# Patient Record
Sex: Male | Born: 1975 | Race: White | Hispanic: No | Marital: Single | Smoking: Never smoker
Health system: Southern US, Community
[De-identification: ages and names within clinical notes are randomized; demographics above are authoritative.]

## PROBLEM LIST (undated history)

## (undated) DIAGNOSIS — Z9289 Personal history of other medical treatment: Secondary | ICD-10-CM

## (undated) DIAGNOSIS — I499 Cardiac arrhythmia, unspecified: Secondary | ICD-10-CM

## (undated) DIAGNOSIS — K219 Gastro-esophageal reflux disease without esophagitis: Secondary | ICD-10-CM

## (undated) DIAGNOSIS — K760 Fatty (change of) liver, not elsewhere classified: Secondary | ICD-10-CM

## (undated) DIAGNOSIS — K420 Umbilical hernia with obstruction, without gangrene: Secondary | ICD-10-CM

## (undated) DIAGNOSIS — F419 Anxiety disorder, unspecified: Secondary | ICD-10-CM

## (undated) DIAGNOSIS — M25519 Pain in unspecified shoulder: Secondary | ICD-10-CM

## (undated) DIAGNOSIS — K8010 Calculus of gallbladder with chronic cholecystitis without obstruction: Secondary | ICD-10-CM

## (undated) DIAGNOSIS — E0590 Thyrotoxicosis, unspecified without thyrotoxic crisis or storm: Secondary | ICD-10-CM

## (undated) HISTORY — PX: WISDOM TOOTH EXTRACTION: SHX21

---
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
## 2015-11-18 ENCOUNTER — Encounter (HOSPITAL_COMMUNITY): Payer: Self-pay | Admitting: Emergency Medicine

## 2015-11-18 ENCOUNTER — Emergency Department (HOSPITAL_COMMUNITY)
Admission: EM | Admit: 2015-11-18 | Discharge: 2015-11-19 | Disposition: A | Discharge status: Home / Self Care | Payer: BLUE CROSS/BLUE SHIELD | Attending: Emergency Medicine | Admitting: Emergency Medicine

## 2015-11-18 DIAGNOSIS — K8050 Calculus of bile duct without cholangitis or cholecystitis without obstruction: Secondary | ICD-10-CM | POA: Insufficient documentation

## 2015-11-18 DIAGNOSIS — R1011 Right upper quadrant pain: Secondary | ICD-10-CM | POA: Diagnosis present

## 2015-11-18 DIAGNOSIS — Z79899 Other long term (current) drug therapy: Secondary | ICD-10-CM | POA: Diagnosis not present

## 2015-11-18 DIAGNOSIS — K219 Gastro-esophageal reflux disease without esophagitis: Secondary | ICD-10-CM | POA: Insufficient documentation

## 2015-11-18 HISTORY — DX: Gastro-esophageal reflux disease without esophagitis: K21.9

## 2015-11-18 NOTE — ED Notes (Signed)
Pt states that on Sunday he drank some liquor and experienced multiple episodes of emesis. Ever since then, pt has had right sided abdominal pain that occasionally goes to his flank. Pt reports it is a burning muscular feeling. Pt states this happened months ago when he drank a liquor drink. Pt states his pain is worse when he takes a deep breath. Pt denies n,v, d. Pt denies urinary symptoms.

## 2015-11-19 ENCOUNTER — Emergency Department (HOSPITAL_COMMUNITY): Payer: BLUE CROSS/BLUE SHIELD

## 2015-11-19 ENCOUNTER — Encounter (HOSPITAL_COMMUNITY): Payer: Self-pay | Admitting: Emergency Medicine

## 2015-11-19 LAB — CBC
HEMATOCRIT: 43.1 % (ref 39.0–52.0)
Hemoglobin: 15.2 g/dL (ref 13.0–17.0)
MCH: 28 pg (ref 26.0–34.0)
MCHC: 35.3 g/dL (ref 30.0–36.0)
MCV: 79.5 fL (ref 78.0–100.0)
Platelets: 251 10*3/uL (ref 150–400)
RBC: 5.42 MIL/uL (ref 4.22–5.81)
RDW: 13.2 % (ref 11.5–15.5)
WBC: 10.8 10*3/uL — ABNORMAL HIGH (ref 4.0–10.5)

## 2015-11-19 LAB — COMPREHENSIVE METABOLIC PANEL
ALBUMIN: 4.6 g/dL (ref 3.5–5.0)
ALK PHOS: 92 U/L (ref 38–126)
ALT: 250 U/L — ABNORMAL HIGH (ref 17–63)
AST: 256 U/L — AB (ref 15–41)
Anion gap: 11 (ref 5–15)
BILIRUBIN TOTAL: 1.6 mg/dL — AB (ref 0.3–1.2)
BUN: 15 mg/dL (ref 6–20)
CALCIUM: 9.3 mg/dL (ref 8.9–10.3)
CO2: 23 mmol/L (ref 22–32)
Chloride: 103 mmol/L (ref 101–111)
Creatinine, Ser: 1.03 mg/dL (ref 0.61–1.24)
GFR calc Af Amer: >60 mL/min (ref >60)
GFR calc non Af Amer: >60 mL/min (ref >60)
GLUCOSE: 129 mg/dL — AB (ref 65–99)
POTASSIUM: 3.6 mmol/L (ref 3.5–5.1)
SODIUM: 137 mmol/L (ref 135–145)
TOTAL PROTEIN: 7.7 g/dL (ref 6.5–8.1)

## 2015-11-19 LAB — URINALYSIS, ROUTINE W REFLEX MICROSCOPIC
BILIRUBIN URINE: NEGATIVE
Glucose, UA: NEGATIVE mg/dL
HGB URINE DIPSTICK: NEGATIVE
KETONES UR: NEGATIVE mg/dL
Leukocytes, UA: NEGATIVE
NITRITE: NEGATIVE
PH: 6.5 (ref 5.0–8.0)
Protein, ur: NEGATIVE mg/dL
SPECIFIC GRAVITY, URINE: 1.013 (ref 1.005–1.030)

## 2015-11-19 LAB — LIPASE, BLOOD: Lipase: 45 U/L (ref 11–51)

## 2015-11-19 MED ORDER — GI COCKTAIL ~~LOC~~
30.0000 mL | Freq: Once | ORAL | Status: AC
Start: 2015-11-19 — End: 2015-11-19
  Administered 2015-11-19: 30 mL via ORAL
  Filled 2015-11-19: qty 30

## 2015-11-19 MED ORDER — PANTOPRAZOLE SODIUM 40 MG PO TBEC: 40.0000 mg | DELAYED_RELEASE_TABLET | Freq: Every day | ORAL | Status: AC

## 2015-11-19 MED ORDER — SUCRALFATE 1 GM/10ML PO SUSP: 1.0000 g | Freq: Three times a day (TID) | ORAL | Status: AC

## 2015-11-19 MED ORDER — DICLOFENAC SODIUM ER 100 MG PO TB24: 100.0000 mg | ORAL_TABLET | Freq: Every day | ORAL | Status: AC

## 2015-11-19 NOTE — Discharge Instructions (Signed)
Cholelithiasis °Cholelithiasis (also called gallstones) is a form of gallbladder disease. The gallbladder is a small organ that helps you digest fats. Symptoms of gallstones are: °· Feeling sick to your stomach (nausea). °· Throwing up (vomiting). °· Belly pain. °· Yellowing of the skin (jaundice). °· Sudden pain. You may feel the pain for minutes to hours. °· Fever. °· Pain to the touch. °HOME CARE °· Only take medicines as told by your doctor. °· Eat a low-fat diet until you see your doctor again. Eating fat can result in pain. °· Follow up with your doctor as told. Attacks usually happen time after time. Surgery is usually needed for permanent treatment. °GET HELP RIGHT AWAY IF:  °· Your pain gets worse. °· Your pain is not helped by medicines. °· You have a fever and lasting symptoms for more than 2-3 days. °· You have a fever and your symptoms suddenly get worse. °· You keep feeling sick to your stomach and throwing up. °MAKE SURE YOU:  °· Understand these instructions. °· Will watch your condition. °· Will get help right away if you are not doing well or get worse. °  °This information is not intended to replace advice given to you by your health care provider. Make sure you discuss any questions you have with your health care provider. °  °Document Released: 01/31/2008 Document Revised: 04/16/2013 Document Reviewed: 02/05/2013 °Elsevier Interactive Patient Education ©2016 [employer] Inc. ° °

## 2015-11-19 NOTE — ED Notes (Signed)
Pt reports previous RUQ pain radiating to back rated 9/10, currently resolved to 2/10. He admits to heavy alcohol intake up until Feb 2017 when he made "massive changes" to his diet and lifestyle, eliminating most alcohol intake from his diet.  No prior dx of pancreatitis per pt report. He states that he had some drinks on Sunday and later that evening began feeling the abdominal pain and bloating as well as nausea and vomiting.  The pain and nausea continued for several days but now after arrival to the ED have mostly dissipated.  This RN educated the patient about the signs and symptoms of hepatic and pancreatic diseases and the likelihood of developing similar conditions if he does not stop drinking alcohol.  He verbalized understanding and stated that he is ready to quit drinking altogether.

## 2015-11-19 NOTE — ED Provider Notes (Signed)
CSN: [id number]     Arrival date & time 11/18/15  2336 History   By signing my name below, I, Arlan Organ, attest that this documentation has been prepared under the direction and in the presence of Shronda Boeh, MD.  Electronically Signed: Arlan Organ, ED Scribe. 11/19/2015. 2:06 AM.   Chief Complaint  Patient presents with  . Abdominal Pain   Patient is a 41 y.o. male presenting with abdominal pain. The history is provided by the patient. No language interpreter was used.  Abdominal Pain Pain location:  RUQ Pain quality: sharp   Pain radiates to:  Back Pain severity:  Moderate Duration:  5 days Timing:  Intermittent Progression:  Improving Chronicity:  New Context: alcohol use and diet changes   Relieved by:  None tried Worsened by:  Nothing tried Ineffective treatments:  None tried Associated symptoms: nausea   Associated symptoms: no chest pain, no chills, no cough, no fever, no shortness of breath and no vomiting   Risk factors: has not had multiple surgeries     HPI Comments: Brad Crosby is a 40 y.o. male with a PMHx of GERD who presents to the Emergency Department complaining of intermittent, ongoing RUQ abdominal pain that radiates to the back x 4-5 fays. Currently he rates pain 2/10. Pt admits to heavy alcohol intake 6 months ago but has cut back in the last several months. However, pt admits to having a few margaritas along with [place] food over the weekend and in the last several days. Pt also reports intermittent nausea. No aggravating or alleviating factors at this time. No OTC medications or home remedies attempted prior to arrival. No recent fever, chills, vomiting, diarrhea, chest pain, or shortness of breath. No urinary symptoms reported.  PCP: No primary care provider on file.    Past Medical History  Diagnosis Date  . GERD (gastroesophageal reflux disease)    History reviewed. No pertinent past surgical history. History reviewed. No pertinent family  history. Social History  Substance Use Topics  . Smoking status: Never Smoker   . Smokeless tobacco: None  . Alcohol Use: Yes     Comment: socially     Review of Systems  Constitutional: Negative for fever and chills.  Respiratory: Negative for cough and shortness of breath.   Cardiovascular: Negative for chest pain.  Gastrointestinal: Positive for nausea and abdominal pain. Negative for vomiting.  Neurological: Negative for headaches.  Psychiatric/Behavioral: Negative for confusion.  All other systems reviewed and are negative.     Allergies  Codeine  Home Medications   Prior to Admission medications   Medication Sig Start Date End Date Taking? Authorizing Provider  Multiple Vitamin (MULTIVITAMIN WITH MINERALS) TABS tablet Take 1 tablet by mouth daily.   Yes Historical Provider, MD  omeprazole (PRILOSEC OTC) 20 MG tablet Take 20 mg by mouth daily.   Yes Historical Provider, MD   Triage Vitals: BP 139/95 mmHg  Pulse 91  Temp(Src) 97.8 F (36.6 C) (Oral)  Resp 16  Ht  (1.803 m)  Wt 245 lb (111.131 kg)  BMI 34.19 kg/m2  SpO2 99%   Physical Exam  Constitutional: He is oriented to person, place, and time. He appears well-developed and well-nourished.  HENT:  Head: Normocephalic and atraumatic.  Mouth/Throat: Oropharynx is clear and moist.  Eyes: EOM are normal. Pupils are equal, round, and reactive to light.  Neck: Normal range of motion.  Cardiovascular: Normal rate, regular rhythm, normal heart sounds and intact distal pulses.   Pulmonary/Chest:  Effort normal and breath sounds normal. No respiratory distress. He has no wheezes. He has no rales.  Abdominal: Soft. He exhibits no distension. There is no tenderness. There is no rigidity, no rebound, no guarding, no tenderness at McBurney's point and negative Murphy's sign.  Gassy   Musculoskeletal: Normal range of motion.  Neurological: He is alert and oriented to person, place, and time.  Skin: Skin is warm and  dry.  Psychiatric: He has a normal mood and affect. Judgment normal.  Nursing note and vitals reviewed.   ED Course  Procedures (including critical care time)  DIAGNOSTIC STUDIES: Oxygen Saturation is 99% on RA, Normal by my interpretation.    COORDINATION OF CARE: 2:06 AM- Will order imaging, blood work, and urinalysis, Will give GI cocktail. Discussed treatment plan with pt at bedside and pt agreed to plan.     Labs Review Labs Reviewed  COMPREHENSIVE METABOLIC PANEL - Abnormal; Notable for the following:    Glucose, Bld 129 (*)    AST 256 (*)    ALT 250 (*)    Total Bilirubin 1.6 (*)    All other components within normal limits  CBC - Abnormal; Notable for the following:    WBC 10.8 (*)    All other components within normal limits  LIPASE, BLOOD  URINALYSIS, ROUTINE W REFLEX MICROSCOPIC (NOT AT [facility])    Imaging Review No results found. I have personally reviewed and evaluated these images and lab results as part of my medical decision-making.   EKG Interpretation None      MDM   Final diagnoses:  None    Results for orders placed or performed during the hospital encounter of 11/18/15  Lipase, blood  Result Value Ref Range   Lipase 45 11 - 51 U/L  Comprehensive metabolic panel  Result Value Ref Range   Sodium 137 135 - 145 mmol/L   Potassium 3.6 3.5 - 5.1 mmol/L   Chloride 103 101 - 111 mmol/L   CO2 23 22 - 32 mmol/L   Glucose, Bld 129 (H) 65 - 99 mg/dL   BUN 15 6 - 20 mg/dL   Creatinine, Ser 9.141.03 0.61 - 1.24 mg/dL   Calcium 9.3 8.9 - 78.210.3 mg/dL   Total Protein 7.7 6.5 - 8.1 g/dL   Albumin 4.6 3.5 - 5.0 g/dL   AST 956256 (H) 15 - 41 U/L   ALT 250 (H) 17 - 63 U/L   Alkaline Phosphatase 92 38 - 126 U/L   Total Bilirubin 1.6 (H) 0.3 - 1.2 mg/dL   GFR calc non Af Amer >60 >60 mL/min   GFR calc Af Amer >60 >60 mL/min   Anion gap 11 5 - 15  CBC  Result Value Ref Range   WBC 10.8 (H) 4.0 - 10.5 K/uL   RBC 5.42 4.22 - 5.81 MIL/uL   Hemoglobin 15.2 13.0 -  17.0 g/dL   HCT 21.343.1 08.639.0 - 57.852.0 %   MCV 79.5 78.0 - 100.0 fL   MCH 28.0 26.0 - 34.0 pg   MCHC 35.3 30.0 - 36.0 g/dL   RDW 46.913.2 62.911.5 - 52.815.5 %   Platelets 251 150 - 400 K/uL  Urinalysis, Routine w reflex microscopic (not at [facility])  Result Value Ref Range   Color, Urine YELLOW YELLOW   APPearance CLOUDY (A) CLEAR   Specific Gravity, Urine 1.013 1.005 - 1.030   pH 6.5 5.0 - 8.0   Glucose, UA NEGATIVE NEGATIVE mg/dL   Hgb urine dipstick NEGATIVE NEGATIVE  Bilirubin Urine NEGATIVE NEGATIVE   Ketones, ur NEGATIVE NEGATIVE mg/dL   Protein, ur NEGATIVE NEGATIVE mg/dL   Nitrite NEGATIVE NEGATIVE   Leukocytes, UA NEGATIVE NEGATIVE   US Abdomen Complete  11/19/2015  CLINICAL DATA:  Right upper quadrant pain for 1 day EXAM: ABDOMEN ULTRASOUND COMPLETE COMPARISON:  None. FINDINGS: Gallbladder: Tiny stones and sludge demonstrated within the gallbladder. Suggestion of the nondependent structure which may indicate a floating stone or polyp. This structure measures about 5 mm diameter. Based on size, this would be a benign polyp. No bladder wall thickening. Murphy's sign is negative. Common bile duct: Diameter: 3.9 mm, normal Liver: Diffusely increased parenchymal echotexture suggesting fatty infiltration. No focal liver lesions identified. IVC: No abnormality visualized. Pancreas: Limited visualization due to overlying bowel gas. Spleen: Size and appearance within normal limits. Right Kidney: Length: 11.6 cm. Echogenicity within normal limits. No mass or hydronephrosis visualized. Left Kidney: Length: 12.7 cm. Echogenicity within normal limits. No mass or hydronephrosis visualized. Abdominal aorta: No aneurysm visualized. Other findings: None. IMPRESSION: Tiny stones and sludge throughout the gallbladder. No additional changes to suggest cholecystitis. Probable fatty infiltration of the liver. No bile duct dilatation. Electronically Signed   By: Burman Nieves M.D.   On: 11/19/2015 03:27    Bland diet,  lengthy instructions given and call [facility] in am to schedule an appointment to discuss elective gall bladder removal.  Strict return precautions given.  Patient verbalizes understanding and agrees to follow up  I personally performed the services described in this documentation, which was scribed in my presence. The recorded information has been reviewed and is accurate.     Cy Blamer, MD 11/19/15 [phone]

## 2015-11-25 ENCOUNTER — Other Ambulatory Visit: Payer: Self-pay | Admitting: General Surgery

## 2015-12-14 ENCOUNTER — Other Ambulatory Visit (HOSPITAL_COMMUNITY): Payer: Self-pay | Admitting: *Deleted

## 2015-12-15 ENCOUNTER — Encounter (HOSPITAL_COMMUNITY)
Admission: RE | Admit: 2015-12-15 | Discharge: 2015-12-15 | Disposition: A | Discharge status: Home / Self Care | Payer: BLUE CROSS/BLUE SHIELD | Source: Ambulatory Visit | Attending: General Surgery | Admitting: General Surgery

## 2015-12-15 ENCOUNTER — Encounter (HOSPITAL_COMMUNITY): Payer: Self-pay

## 2015-12-15 DIAGNOSIS — R001 Bradycardia, unspecified: Secondary | ICD-10-CM | POA: Diagnosis not present

## 2015-12-15 DIAGNOSIS — Z01812 Encounter for preprocedural laboratory examination: Secondary | ICD-10-CM | POA: Diagnosis not present

## 2015-12-15 DIAGNOSIS — K8020 Calculus of gallbladder without cholecystitis without obstruction: Secondary | ICD-10-CM | POA: Diagnosis not present

## 2015-12-15 DIAGNOSIS — Z01818 Encounter for other preprocedural examination: Secondary | ICD-10-CM | POA: Diagnosis not present

## 2015-12-15 HISTORY — DX: Anxiety disorder, unspecified: F41.9

## 2015-12-15 HISTORY — DX: Cardiac arrhythmia, unspecified: I49.9

## 2015-12-15 HISTORY — DX: Thyrotoxicosis, unspecified without thyrotoxic crisis or storm: E05.90

## 2015-12-15 HISTORY — DX: Fatty (change of) liver, not elsewhere classified: K76.0

## 2015-12-15 HISTORY — DX: Pain in unspecified shoulder: M25.519

## 2015-12-15 HISTORY — DX: Personal history of other medical treatment: Z92.89

## 2015-12-15 LAB — COMPREHENSIVE METABOLIC PANEL
ALK PHOS: 67 U/L (ref 38–126)
ALT: 43 U/L (ref 17–63)
ANION GAP: 9 (ref 5–15)
AST: 29 U/L (ref 15–41)
Albumin: 4 g/dL (ref 3.5–5.0)
BILIRUBIN TOTAL: 1 mg/dL (ref 0.3–1.2)
BUN: 8 mg/dL (ref 6–20)
CALCIUM: 9.4 mg/dL (ref 8.9–10.3)
CO2: 23 mmol/L (ref 22–32)
CREATININE: 1.16 mg/dL (ref 0.61–1.24)
Chloride: 109 mmol/L (ref 101–111)
GFR calc non Af Amer: >60 mL/min (ref >60)
GLUCOSE: 100 mg/dL — AB (ref 65–99)
Potassium: 4.1 mmol/L (ref 3.5–5.1)
Sodium: 141 mmol/L (ref 135–145)
TOTAL PROTEIN: 7.5 g/dL (ref 6.5–8.1)

## 2015-12-15 LAB — CBC WITH DIFFERENTIAL/PLATELET
Basophils Absolute: 0 10*3/uL (ref 0.0–0.1)
Basophils Relative: 0 %
Eosinophils Absolute: 0.1 10*3/uL (ref 0.0–0.7)
Eosinophils Relative: 2 %
HEMATOCRIT: 44.5 % (ref 39.0–52.0)
HEMOGLOBIN: 14.7 g/dL (ref 13.0–17.0)
LYMPHS ABS: 1.8 10*3/uL (ref 0.7–4.0)
LYMPHS PCT: 37 %
MCH: 27.3 pg (ref 26.0–34.0)
MCHC: 33 g/dL (ref 30.0–36.0)
MCV: 82.7 fL (ref 78.0–100.0)
MONOS PCT: 10 %
Monocytes Absolute: 0.5 10*3/uL (ref 0.1–1.0)
NEUTROS ABS: 2.5 10*3/uL (ref 1.7–7.7)
NEUTROS PCT: 51 %
Platelets: 220 10*3/uL (ref 150–400)
RBC: 5.38 MIL/uL (ref 4.22–5.81)
RDW: 13.3 % (ref 11.5–15.5)
WBC: 4.8 10*3/uL (ref 4.0–10.5)

## 2015-12-15 NOTE — Pre-Procedure Instructions (Signed)
Jerilynn SomMichael Gaymon  12/15/2015      [id number] [facility]- [place], [place] - [phone] [place] [address] Phone: [phone] Fax: [phone]    Your procedure is scheduled on  12/21/2015.  [facility] Admitting at 5:30  A.M.  Call this number if you have problems the morning of surgery:  [phone]   Remember:  Do not eat food or drink liquids after midnight. On MONDAY  Take these medicines the morning of surgery with A SIP OF WATER : BUSPAR, PROTONIX   Do not wear jewelry   Do not wear lotions, powders, or perfumes.  You may wear deodorant.     Men may shave face and neck.   Do not bring valuables to the hospital.   [facility] is not responsible for any belongings or valuables.  Contacts, dentures or bridgework may not be worn into surgery.  Leave your suitcase in the car.  After surgery it may be brought to your room.  For patients admitted to the hospital, discharge time will be determined by your treatment team.  Patients discharged the day of surgery will not be allowed to drive home.   Name and phone number of your driver:   Sylvie Farrier/with Alicia  Special instructions:  Special Instructions: [facility] - Preparing for Surgery  Before surgery, you can play an important role.  Because skin is not sterile, your skin needs to be as free of germs as possible.  You can reduce the number of germs on you skin by washing with CHG (chlorahexidine gluconate) soap before surgery.  CHG is an antiseptic cleaner which kills germs and bonds with the skin to continue killing germs even after washing.  Please DO NOT use if you have an allergy to CHG or antibacterial soaps.  If your skin becomes reddened/irritated stop using the CHG and inform your nurse when you arrive at Short Stay.  Do not shave (including legs and underarms) for at least 48 hours prior to the first CHG shower.  You may shave your face.  Please follow these instructions  carefully:   1.  Shower with CHG Soap the night before surgery and the  morning of Surgery.  2.  If you choose to wash your hair, wash your hair first as usual with your  normal shampoo.  3.  After you shampoo, rinse your hair and body thoroughly to remove the  Shampoo.  4.  Use CHG as you would any other liquid soap.  You can apply chg directly to the skin and wash gently with scrungie or a clean washcloth.  5.  Apply the CHG Soap to your body ONLY FROM THE NECK DOWN.    Do not use on open wounds or open sores.  Avoid contact with your eyes, ears, mouth and genitals (private parts).  Wash genitals (private parts)   with your normal soap.  6.  Wash thoroughly, paying special attention to the area where your surgery will be performed.  7.  Thoroughly rinse your body with warm water from the neck down.  8.  DO NOT shower/wash with your normal soap after using and rinsing off   the CHG Soap.  9.  Pat yourself dry with a clean towel.            10.  Wear clean pajamas.            11.  Place clean sheets on your bed the night of  your first shower and do not sleep with pets.  Day of Surgery  Do not apply any lotions/deodorants the morning of surgery.  Please wear clean clothes to the hospital/surgery center.  Please read over the following fact sheets that you were given. Pain Booklet, Coughing and Deep Breathing and Surgical Site Infection Prevention

## 2015-12-19 NOTE — H&P (Signed)
Brad Crosby  Location: [facility] Surgery Patient #: [id number] DOB: 1976/02/01 Single / Language: Brad Crosby / Race: White Male       History of Present Illness   The patient is a 40 year old male who presents for evaluation of gall stones. This is a pleasant 40 year old gentleman, referred by Dr. Nicanor Alcon in the ED for evaluation of symptomatic gallstones. He is asymptomatic today.  This gentleman presented to the emergency department on November 19, 2015. He had a 5 day history of intermittent episodes of right upper quadrant pain, nausea, radiation to his back. Intermittent diarrhea recently. He had had 2 margaritas and [place] food the day of his presentation to the ED. He'll similar episode about 2 months ago that lasted 4-5 hours. Denies jaundice, acholic stools, dark urine, history of liver disease or hepatitis.  Labwork showed total bilirubin 1.6, AST 256, ALT 250, WBC 10,800. Normal lipase. Labs otherwise normal. Ultrasound showed tiny gallstones. No inflammation. CBD 3.9 mm. No biliary ductal dilatation.  Past history significant for GERD. He drank heavily many months ago after his father died but hasn't been drinking much at all lately. No history of liver disease cardiovascular disease pulmonary disease or urologic problems.  He is single. Has a girlfriend. No children. Former smoker. Works in Consulting civil engineer at the [employer].  Mother living has had her gallbladder out. Father deceased had cholecystectomy. Was paraplegic. died of MRSA.  The patient will be scheduled for laparoscopic cholecystectomy with cholangiogram, possible open. I discussed the indications, details, techniques, and numerous risk of the surgery with him. He is aware of the risk of bleeding, infection, conversion to open laparotomy, bile leak, wound hernia, injury to adjacent organs requiring major reconstructive surgery, and other unforeseen problems. He understands all of these  issues. All of his questions were answered. He agrees with this plan.   Other Problems  Anxiety Disorder Cholelithiasis Gastroesophageal Reflux Disease Thyroid Disease  Past Surgical History  No pertinent past surgical history  Diagnostic Studies History Colonoscopy never  Allergies  Codeine Phosphate *ANALGESICS - OPIOID*  Medication History PriLOSEC (  Capsule DR, Oral) Active. Multiple Vitamin (1 (one) Oral) Active. Diclofenac Sodium ER (  Tablet ER 24HR, Oral) Active. Protonix (  Tablet DR, Oral) Active. Sucralfate (1GM/10ML Suspension, Oral) Active. Medications Reconciled  Social History  Alcohol use Recently quit alcohol use. Caffeine use Tea. No drug use Tobacco use Former smoker.  Family History  Arthritis Mother. Migraine Headache Mother.    Review of Systems  General Present- Fatigue. Not Present- Appetite Loss, Chills, Fever, Night Sweats, Weight Gain and Weight Loss. Skin Not Present- Change in Wart/Mole, Dryness, Hives, Jaundice, New Lesions, Non-Healing Wounds, Rash and Ulcer. HEENT Not Present- Earache, Hearing Loss, Hoarseness, Nose Bleed, Oral Ulcers, Ringing in the Ears, Seasonal Allergies, Sinus Pain, Sore Throat, Visual Disturbances, Wears glasses/contact lenses and Yellow Eyes. Breast Not Present- Breast Mass, Breast Pain, Nipple Discharge and Skin Changes. Cardiovascular Not Present- Chest Pain, Difficulty Breathing Lying Down, Leg Cramps, Palpitations, Rapid Heart Rate, Shortness of Breath and Swelling of Extremities. Gastrointestinal Present- Bloating, Change in Bowel Habits and Excessive gas. Not Present- Abdominal Pain, Bloody Stool, Chronic diarrhea, Constipation, Difficulty Swallowing, Gets full quickly at meals, Hemorrhoids, Indigestion, Nausea, Rectal Pain and Vomiting. Male Genitourinary Not Present- Blood in Urine, Change in Urinary Stream, Frequency, Impotence, Nocturia, Painful Urination, Urgency and Urine  Leakage. Musculoskeletal Not Present- Back Pain, Joint Pain, Joint Stiffness, Muscle Pain, Muscle Weakness and Swelling of Extremities. Neurological Not Present- Decreased Memory, Fainting,  Headaches, Numbness, Seizures, Tingling, Tremor, Trouble walking and Weakness. Psychiatric Present- Anxiety. Not Present- Bipolar, Change in Sleep Pattern, Depression, Fearful and Frequent crying. Endocrine Not Present- Cold Intolerance, Excessive Hunger, Hair Changes, Heat Intolerance, Hot flashes and New Diabetes. Hematology Not Present- Easy Bruising, Excessive bleeding, Gland problems, HIV and Persistent Infections.  Vitals  Weight: 242 lb Height: 71in Body Surface Area: 2.29 m Body Mass Index: 33.75 kg/m  Temp.: 97.39F(Temporal)  Pulse: 78 (Regular)  BP: 128/80 (Sitting, Left Arm, Standard)       Physical Exam  General Mental Status-Alert. General Appearance-Consistent with stated age. Hydration-Well hydrated. Voice-Normal.  Head and Neck Head-normocephalic, atraumatic with no lesions or palpable masses. Trachea-midline. Thyroid Gland Characteristics - normal size and consistency.  Eye Eyeball - Bilateral-Extraocular movements intact. Sclera/Conjunctiva - Bilateral-No scleral icterus.  Chest and Lung Exam Chest and lung exam reveals -quiet, even and easy respiratory effort with no use of accessory muscles and on auscultation, normal breath sounds, no adventitious sounds and normal vocal resonance. Inspection Chest Wall - Normal. Back - normal.  Cardiovascular Cardiovascular examination reveals -normal heart sounds, regular rate and rhythm with no murmurs and normal pedal pulses bilaterally.  Abdomen Note: Borderline obese. Soft. Nontender to palpate or percuss. No mass. Tiny umbilical hernia. Lipoma left lower quadrant. No inguinal mass.   Neurologic Neurologic evaluation reveals -alert and oriented x 3 with no impairment of recent or  remote memory. Mental Status-Normal.  Musculoskeletal Normal Exam - Left-Upper Extremity Strength Normal and Lower Extremity Strength Normal. Normal Exam - Right-Upper Extremity Strength Normal and Lower Extremity Strength Normal.  Lymphatic Head & Neck  General Head & Neck Lymphatics: Bilateral - Description - Normal. Axillary  General Axillary Region: Bilateral - Description - Normal. Tenderness - Non Tender. Femoral & Inguinal  Generalized Femoral & Inguinal Lymphatics: Bilateral - Description - Normal. Tenderness - Non Tender.    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 This is most likely due to the gallbladder. Less likely due to liver disease. Less likely due to alcohol. Cancer is unlikely.  These attacks will continue with the future, and we have decided to go ahead with gallbladder surgery you will be scheduled for laparoscopic cholecystectomy with cholangiogram, possible open cholecystectomy if everything goes well you can go home the same day. We have discussed the indications, techniques, and numerous risk of this surgery in detail Please read the patient information booklet that I reviewed and gave to you.  BMI 33.0-33.9,ADULT (Z68.33) CHRONIC GERD (K21.9)   Maui Britten M. Derrell LollingIngram, M.D., [facility] General and Minimally invasive Surgery Breast and Colorectal Surgery Office:   [phone] Pager:   [phone]

## 2015-12-20 MED ORDER — CEFAZOLIN SODIUM-DEXTROSE 2-4 GM/100ML-% IV SOLN
2.0000 g | INTRAVENOUS | Status: AC
  Administered 2015-12-21: 2 g via INTRAVENOUS
  Filled 2015-12-20: qty 100

## 2015-12-20 MED ORDER — CHLORHEXIDINE GLUCONATE 4 % EX LIQD: 1.0000 "application " | Freq: Once | CUTANEOUS | Status: DC

## 2015-12-21 ENCOUNTER — Ambulatory Visit (HOSPITAL_COMMUNITY): Payer: BLUE CROSS/BLUE SHIELD | Admitting: Certified Registered Nurse Anesthetist

## 2015-12-21 ENCOUNTER — Encounter (HOSPITAL_COMMUNITY)
Admission: RE | Disposition: A | Discharge status: Home / Self Care | Payer: Self-pay | Source: Ambulatory Visit | Attending: General Surgery

## 2015-12-21 ENCOUNTER — Encounter (HOSPITAL_COMMUNITY): Payer: Self-pay | Admitting: Certified Registered Nurse Anesthetist

## 2015-12-21 ENCOUNTER — Ambulatory Visit (HOSPITAL_COMMUNITY)
Admission: RE | Admit: 2015-12-21 | Discharge: 2015-12-21 | Disposition: A | Discharge status: Home / Self Care | Payer: BLUE CROSS/BLUE SHIELD | Source: Ambulatory Visit | Attending: General Surgery | Admitting: General Surgery

## 2015-12-21 ENCOUNTER — Ambulatory Visit (HOSPITAL_COMMUNITY): Payer: BLUE CROSS/BLUE SHIELD

## 2015-12-21 DIAGNOSIS — F419 Anxiety disorder, unspecified: Secondary | ICD-10-CM | POA: Insufficient documentation

## 2015-12-21 DIAGNOSIS — E0590 Thyrotoxicosis, unspecified without thyrotoxic crisis or storm: Secondary | ICD-10-CM | POA: Diagnosis not present

## 2015-12-21 DIAGNOSIS — Z87891 Personal history of nicotine dependence: Secondary | ICD-10-CM | POA: Diagnosis not present

## 2015-12-21 DIAGNOSIS — K420 Umbilical hernia with obstruction, without gangrene: Secondary | ICD-10-CM | POA: Diagnosis not present

## 2015-12-21 DIAGNOSIS — E669 Obesity, unspecified: Secondary | ICD-10-CM | POA: Diagnosis not present

## 2015-12-21 DIAGNOSIS — K8010 Calculus of gallbladder with chronic cholecystitis without obstruction: Secondary | ICD-10-CM | POA: Diagnosis present

## 2015-12-21 DIAGNOSIS — Z419 Encounter for procedure for purposes other than remedying health state, unspecified: Secondary | ICD-10-CM

## 2015-12-21 HISTORY — DX: Calculus of gallbladder with chronic cholecystitis without obstruction: K80.10

## 2015-12-21 HISTORY — DX: Umbilical hernia with obstruction, without gangrene: K42.0

## 2015-12-21 HISTORY — PX: CHOLECYSTECTOMY: SHX55

## 2015-12-21 HISTORY — PX: UMBILICAL HERNIA REPAIR: SHX196

## 2015-12-21 SURGERY — LAPAROSCOPIC CHOLECYSTECTOMY WITH INTRAOPERATIVE CHOLANGIOGRAM
Anesthesia: General | Site: Abdomen

## 2015-12-21 MED ORDER — ONDANSETRON HCL 4 MG/2ML IJ SOLN
INTRAMUSCULAR | Status: AC
  Filled 2015-12-21: qty 2

## 2015-12-21 MED ORDER — HYDROCODONE-ACETAMINOPHEN 5-325 MG PO TABS: 1.0000 | ORAL_TABLET | Freq: Four times a day (QID) | ORAL | Status: AC | PRN

## 2015-12-21 MED ORDER — PROPOFOL 10 MG/ML IV BOLUS
INTRAVENOUS | Status: DC | PRN
  Administered 2015-12-21: 200 mg via INTRAVENOUS

## 2015-12-21 MED ORDER — NEOSTIGMINE METHYLSULFATE 10 MG/10ML IV SOLN
INTRAVENOUS | Status: DC | PRN
  Administered 2015-12-21: 5 mg via INTRAVENOUS

## 2015-12-21 MED ORDER — HYDROMORPHONE HCL 1 MG/ML IJ SOLN
0.2500 mg | INTRAMUSCULAR | Status: DC | PRN
  Administered 2015-12-21 (×2): 0.5 mg via INTRAVENOUS

## 2015-12-21 MED ORDER — METOPROLOL TARTRATE 1 MG/ML IV SOLN
INTRAVENOUS | Status: AC
  Filled 2015-12-21: qty 5

## 2015-12-21 MED ORDER — DEXAMETHASONE SODIUM PHOSPHATE 10 MG/ML IJ SOLN
INTRAMUSCULAR | Status: AC
  Filled 2015-12-21: qty 1

## 2015-12-21 MED ORDER — DEXAMETHASONE SODIUM PHOSPHATE 10 MG/ML IJ SOLN
INTRAMUSCULAR | Status: DC | PRN
  Administered 2015-12-21: 10 mg via INTRAVENOUS

## 2015-12-21 MED ORDER — HYDROCODONE-ACETAMINOPHEN 5-325 MG PO TABS
1.0000 | ORAL_TABLET | Freq: Once | ORAL | Status: AC
  Administered 2015-12-21: 1 via ORAL

## 2015-12-21 MED ORDER — HYDROCODONE-ACETAMINOPHEN 5-325 MG PO TABS
ORAL_TABLET | ORAL | Status: AC
  Filled 2015-12-21: qty 1

## 2015-12-21 MED ORDER — OXYCODONE HCL 5 MG PO TABS: 5.0000 mg | ORAL_TABLET | Freq: Once | ORAL | Status: DC | PRN

## 2015-12-21 MED ORDER — BUPIVACAINE-EPINEPHRINE (PF) 0.5% -1:200000 IJ SOLN
INTRAMUSCULAR | Status: AC
  Filled 2015-12-21: qty 30

## 2015-12-21 MED ORDER — MIDAZOLAM HCL 2 MG/2ML IJ SOLN
INTRAMUSCULAR | Status: AC
  Filled 2015-12-21: qty 2

## 2015-12-21 MED ORDER — NEOSTIGMINE METHYLSULFATE 10 MG/10ML IV SOLN
INTRAVENOUS | Status: AC
  Filled 2015-12-21: qty 1

## 2015-12-21 MED ORDER — MIDAZOLAM HCL 2 MG/2ML IJ SOLN
INTRAMUSCULAR | Status: DC | PRN
  Administered 2015-12-21: 0.5 mg via INTRAVENOUS

## 2015-12-21 MED ORDER — ROCURONIUM BROMIDE 100 MG/10ML IV SOLN
INTRAVENOUS | Status: DC | PRN
  Administered 2015-12-21: 50 mg via INTRAVENOUS

## 2015-12-21 MED ORDER — HYDROMORPHONE HCL 1 MG/ML IJ SOLN
INTRAMUSCULAR | Status: AC
  Filled 2015-12-21: qty 1

## 2015-12-21 MED ORDER — ONDANSETRON HCL 4 MG/2ML IJ SOLN
INTRAMUSCULAR | Status: DC | PRN
  Administered 2015-12-21: 4 mg via INTRAVENOUS

## 2015-12-21 MED ORDER — ONDANSETRON HCL 4 MG/2ML IJ SOLN
4.0000 mg | Freq: Four times a day (QID) | INTRAMUSCULAR | Status: AC | PRN
  Administered 2015-12-21: 4 mg via INTRAVENOUS

## 2015-12-21 MED ORDER — LIDOCAINE HCL (CARDIAC) 20 MG/ML IV SOLN
INTRAVENOUS | Status: DC | PRN
  Administered 2015-12-21: 60 mg via INTRATRACHEAL

## 2015-12-21 MED ORDER — 0.9 % SODIUM CHLORIDE (POUR BTL) OPTIME
TOPICAL | Status: DC | PRN
Start: 2015-12-21 — End: 2015-12-21
  Administered 2015-12-21: 1000 mL

## 2015-12-21 MED ORDER — OXYCODONE HCL 5 MG/5ML PO SOLN: 5.0000 mg | Freq: Once | ORAL | Status: DC | PRN

## 2015-12-21 MED ORDER — PROPOFOL 10 MG/ML IV BOLUS
INTRAVENOUS | Status: AC
  Filled 2015-12-21: qty 20

## 2015-12-21 MED ORDER — FENTANYL CITRATE (PF) 250 MCG/5ML IJ SOLN
INTRAMUSCULAR | Status: AC
  Filled 2015-12-21: qty 5

## 2015-12-21 MED ORDER — LIDOCAINE HCL (CARDIAC) 20 MG/ML IV SOLN
INTRAVENOUS | Status: AC
  Filled 2015-12-21: qty 5

## 2015-12-21 MED ORDER — GLYCOPYRROLATE 0.2 MG/ML IJ SOLN
INTRAMUSCULAR | Status: AC
  Filled 2015-12-21: qty 1

## 2015-12-21 MED ORDER — IOPAMIDOL (ISOVUE-300) INJECTION 61%
INTRAVENOUS | Status: AC
Start: 2015-12-21 — End: 2015-12-21
  Filled 2015-12-21: qty 50

## 2015-12-21 MED ORDER — SODIUM CHLORIDE 0.9 % IV SOLN
INTRAVENOUS | Status: DC | PRN
  Administered 2015-12-21: 11 mL

## 2015-12-21 MED ORDER — ARTIFICIAL TEARS OP OINT
TOPICAL_OINTMENT | OPHTHALMIC | Status: AC
  Filled 2015-12-21: qty 3.5

## 2015-12-21 MED ORDER — METOPROLOL TARTRATE 1 MG/ML IV SOLN
INTRAVENOUS | Status: DC | PRN
  Administered 2015-12-21 (×2): 1 mg via INTRAVENOUS
  Administered 2015-12-21 (×2): 1.5 mg via INTRAVENOUS

## 2015-12-21 MED ORDER — LACTATED RINGERS IV SOLN
INTRAVENOUS | Status: DC | PRN
  Administered 2015-12-21 (×2): via INTRAVENOUS

## 2015-12-21 MED ORDER — GLYCOPYRROLATE 0.2 MG/ML IJ SOLN
INTRAMUSCULAR | Status: AC
  Filled 2015-12-21: qty 3

## 2015-12-21 MED ORDER — GLYCOPYRROLATE 0.2 MG/ML IJ SOLN
INTRAMUSCULAR | Status: DC | PRN
  Administered 2015-12-21: 0.2 mg via INTRAVENOUS
  Administered 2015-12-21: 0.6 mg via INTRAVENOUS

## 2015-12-21 MED ORDER — SODIUM CHLORIDE 0.9 % IR SOLN
Status: DC | PRN
  Administered 2015-12-21: 1000 mL

## 2015-12-21 MED ORDER — FENTANYL CITRATE (PF) 250 MCG/5ML IJ SOLN
INTRAMUSCULAR | Status: DC | PRN
  Administered 2015-12-21: 150 ug via INTRAVENOUS
  Administered 2015-12-21 (×2): 50 ug via INTRAVENOUS

## 2015-12-21 MED ORDER — ROCURONIUM BROMIDE 50 MG/5ML IV SOLN
INTRAVENOUS | Status: AC
Start: 2015-12-21 — End: 2015-12-21
  Filled 2015-12-21: qty 1

## 2015-12-21 MED ORDER — BUPIVACAINE-EPINEPHRINE 0.5% -1:200000 IJ SOLN
INTRAMUSCULAR | Status: DC | PRN
  Administered 2015-12-21: 8 mL

## 2015-12-21 SURGICAL SUPPLY — 41 items
APPLIER CLIP ROT 10 11.4 M/L (STAPLE) ×2
BLADE SURG ROTATE 9660 (MISCELLANEOUS) IMPLANT
CANISTER SUCTION 2500CC (MISCELLANEOUS) ×2 IMPLANT
CHLORAPREP W/TINT 26ML (MISCELLANEOUS) ×2 IMPLANT
CLIP APPLIE ROT 10 11.4 M/L (STAPLE) ×1 IMPLANT
COVER MAYO STAND STRL (DRAPES) ×2 IMPLANT
COVER SURGICAL LIGHT HANDLE (MISCELLANEOUS) ×2 IMPLANT
DERMABOND ADVANCED (GAUZE/BANDAGES/DRESSINGS) ×1
DERMABOND ADVANCED .7 DNX12 (GAUZE/BANDAGES/DRESSINGS) ×1 IMPLANT
DRAPE C-ARM 42X72 X-RAY (DRAPES) ×2 IMPLANT
ELECT REM PT RETURN 9FT ADLT (ELECTROSURGICAL) ×2
ELECTRODE REM PT RTRN 9FT ADLT (ELECTROSURGICAL) ×1 IMPLANT
GLOVE BIO SURGEON STRL SZ 6.5 (GLOVE) ×4 IMPLANT
GLOVE BIOGEL PI IND STRL 7.0 (GLOVE) ×1 IMPLANT
GLOVE BIOGEL PI IND STRL 8 (GLOVE) ×1 IMPLANT
GLOVE BIOGEL PI INDICATOR 7.0 (GLOVE) ×1
GLOVE BIOGEL PI INDICATOR 8 (GLOVE) ×1
GLOVE EUDERMIC 7 POWDERFREE (GLOVE) ×2 IMPLANT
GOWN STRL REUS W/ TWL LRG LVL3 (GOWN DISPOSABLE) ×2 IMPLANT
GOWN STRL REUS W/ TWL XL LVL3 (GOWN DISPOSABLE) ×1 IMPLANT
GOWN STRL REUS W/TWL LRG LVL3 (GOWN DISPOSABLE) ×2
GOWN STRL REUS W/TWL XL LVL3 (GOWN DISPOSABLE) ×1
KIT BASIN OR (CUSTOM PROCEDURE TRAY) ×2 IMPLANT
KIT ROOM TURNOVER OR (KITS) ×2 IMPLANT
NS IRRIG 1000ML POUR BTL (IV SOLUTION) ×2 IMPLANT
PAD ARMBOARD 7.5X6 YLW CONV (MISCELLANEOUS) ×2 IMPLANT
POUCH SPECIMEN RETRIEVAL 10MM (ENDOMECHANICALS) ×2 IMPLANT
SCISSORS LAP 5X35 DISP (ENDOMECHANICALS) ×2 IMPLANT
SET CHOLANGIOGRAPH 5 50 .035 (SET/KITS/TRAYS/PACK) ×2 IMPLANT
SET IRRIG TUBING LAPAROSCOPIC (IRRIGATION / IRRIGATOR) ×2 IMPLANT
SLEEVE ENDOPATH XCEL 5M (ENDOMECHANICALS) ×2 IMPLANT
SPECIMEN JAR SMALL (MISCELLANEOUS) ×2 IMPLANT
SUT MNCRL AB 4-0 PS2 18 (SUTURE) ×2 IMPLANT
SUT VICRYL 0 UR6 27IN ABS (SUTURE) ×4 IMPLANT
TOWEL OR 17X24 6PK STRL BLUE (TOWEL DISPOSABLE) ×2 IMPLANT
TOWEL OR 17X26 10 PK STRL BLUE (TOWEL DISPOSABLE) ×2 IMPLANT
TRAY LAPAROSCOPIC MC (CUSTOM PROCEDURE TRAY) ×2 IMPLANT
TROCAR XCEL BLUNT TIP 100MML (ENDOMECHANICALS) ×2 IMPLANT
TROCAR XCEL NON-BLD 11X100MML (ENDOMECHANICALS) ×2 IMPLANT
TROCAR XCEL NON-BLD 5MMX100MML (ENDOMECHANICALS) ×2 IMPLANT
TUBING INSUFFLATION (TUBING) ×2 IMPLANT

## 2015-12-21 NOTE — Anesthesia Preprocedure Evaluation (Signed)
Anesthesia Evaluation  Patient identified by MRN, date of birth, ID band Patient awake    Reviewed: Allergy & Precautions, NPO status , Patient's Chart, lab work & pertinent test results  Airway Mallampati: II   Neck ROM: full    Dental   Pulmonary neg pulmonary ROS,    breath sounds clear to auscultation       Cardiovascular negative cardio ROS   Rhythm:regular Rate:Normal     Neuro/Psych Anxiety    GI/Hepatic GERD  ,  Endo/Other  Hyperthyroidism obese  Renal/GU      Musculoskeletal   Abdominal   Peds  Hematology   Anesthesia Other Findings   Reproductive/Obstetrics                             Anesthesia Physical Anesthesia Plan  ASA: II  Anesthesia Plan: General   Post-op Pain Management:    Induction: Intravenous  Airway Management Planned: Oral ETT  Additional Equipment:   Intra-op Plan:   Post-operative Plan: Extubation in OR  Informed Consent: I have reviewed the patients History and Physical, chart, labs and discussed the procedure including the risks, benefits and alternatives for the proposed anesthesia with the patient or authorized representative who has indicated his/her understanding and acceptance.     Plan Discussed with: CRNA, Anesthesiologist and Surgeon  Anesthesia Plan Comments:         Anesthesia Quick Evaluation

## 2015-12-21 NOTE — Op Note (Signed)
Patient Name:           Brad Crosby   Date of Surgery:        12/21/2015  Pre op Diagnosis:      Chronic cholecystitis with cholelithiasis                                       Incarcerated umbilical hernia   Post op Diagnosis:    Same  Procedure:                 Laparoscopic cholecystectomy                                      Intraoperative cholangiogram                                      Repair incarcerated umbilical hernia  Surgeon:                     Angelia Mould. Derrell Lolling, M.D., FACS  Assistant:                      Avel Peace, M.D., FACS  Operative Indications:   . This is a pleasant 40 year old gentleman, referred by Dr. Nicanor Alcon in the ED for evaluation of symptomatic gallstones.        This gentleman presented to the emergency department on November 19, 2015. He had a 5 day history of intermittent episodes of right upper quadrant pain, nausea, radiation to his back. Intermittent diarrhea recently. He had had 2 margaritas and [place] food the day of his presentation to the ED. He'll similar episode about 2 months ago that lasted 4-5 hours. Denies jaundice, acholic stools, dark urine, history of liver disease or hepatitis.      Labwork showed total bilirubin 1.6, AST 256, ALT 250, WBC 10,800. Normal lipase. Labs otherwise normal. Ultrasound showed tiny gallstones. No inflammation. CBD 3.9 mm. No biliary ductal dilatation.  His symptoms have subsided somewhat.  Repeat liver function tests preop are normal.       The patient will be scheduled for laparoscopic cholecystectomy with cholangiogram, possible open. I think he has a small incarcerated umbilical hernia and so does he.    Operative Findings:       The gallbladder was chronically inflamed but was thin-walled.  Moderate adhesions to the gallbladder which came down relatively well.  Intraoperative cholangiogram was normal, showing normal intrahepatic and extrahepatic biliary anatomy, no filling defect, and no  obstruction with good flow of contrast into the duodenum.  He had an incarcerated umbilical hernia about 1.5 cm in diameter.  Pre-peritoneal fat was incarcerated in this.  Otherwise, the liver, stomach, duodenum, small intestine, large intestine were grossly normal to inspection.  Procedure in Detail:          Following the induction of general endotracheal anesthesia the patient's abdomen was prepped and draped in a sterile fashion, surgical timeout was performed, intravenous antibiotics were given, and 0.5% Marcaine with epinephrine was used as a local infiltration anesthetic.    A transverse incision was made at the superior rim of the umbilicus.  Dissection was carried down to the deep subcutaneous tissue and fascia.  I found the umbilical  hernia.  I debrided the preperitoneal fat with electrocautery.  I placed a suture at each corner of the defect and placed an 11 mm Hassan trocar through this into the peritoneal space.  Pneumoperitoneum was created and video camera was inserted.  Good visualization.  No evidence of organ injury.  No evidence of bleeding.  11 mm trocar placed in subxiphoid region and two 5 mm trochars in the right upper quadrant. The gallbladder was elevated.  Adhesions were taken down identifying and retracting the infundibulum.  The peritoneum was incised to expose the neck of the gallbladder.  I dissected out the cystic duct and the cystic artery.  I created a nice window behind the structures with a good critical view.  I actually found an anterior branch and the posterior branch of the cystic artery these were ultimately individually clipped and divided.  The cholangiogram catheter was inserted into the cystic duct and a cholangiogram was obtained using the C-arm.  This was normal as described above.  I removed the cholangiogram catheter, controlled the cystic duct with multiple medical clips and divided it.  The gallbladder was dissected from its bed with electrocautery placed in a  specimen bag and removed.  The operative field was irrigated copiously.  The hemostasis was excellent.  There was no bleeding or bile leak whatsoever.  All the fluid was evacuated.  The pneumo peritoneum was released and the trochars were removed.     The umbilical hernia was repaired as a primary tissue repair with several interrupted transverse sutures of 0 Vicryl.  This provided a nice closure.  The wounds were irrigated and skin incisions closed with subcuticular 4-0 Monocryl and Dermabond.  The patient tolerated the procedure well was taken to PACU in stable condition.  EBL 15-20 mL.  Counts correct.  Complications none.   Angelia MouldHaywood M. Derrell LollingIngram, M.D., FACS General and Minimally Invasive Surgery Breast and Colorectal Surgery  12/21/2015 8:55 AM

## 2015-12-21 NOTE — Progress Notes (Signed)
D/C prescription for pain is Hydrocodone. 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 Spoke with pharmacist who suggested that I give the pt a hydrocodone pill in phase 2 and monitor prior to d/c home. Dr. Derrell LollingIngram notified and agreeable with plan. Pt and girlfriend also happy with plan.

## 2015-12-21 NOTE — Anesthesia Postprocedure Evaluation (Signed)
Anesthesia Post Note  Patient: Brad Crosby  Procedure(s) Performed: Procedure(s) (LRB): LAPAROSCOPIC CHOLECYSTECTOMY WITH INTRAOPERATIVE CHOLANGIOGRAM  (N/A) HERNIA REPAIR UMBILICAL ADULT (N/A)  Patient location during evaluation: PACU Anesthesia Type: General Level of consciousness: awake and alert and patient cooperative Pain management: pain level controlled Vital Signs Assessment: post-procedure vital signs reviewed and stable Respiratory status: spontaneous breathing and respiratory function stable Cardiovascular status: stable Anesthetic complications: no    Last Vitals:  Filed Vitals:   12/21/15 1045 12/21/15 1047  BP:  130/86  Pulse: 68 76  Temp:    Resp:      Last Pain:  Filed Vitals:   12/21/15 1059  PainSc: 4                  Cortny Bambach S

## 2015-12-21 NOTE — Discharge Instructions (Signed)
[facility] ______[facility] °LAPAROSCOPIC SURGERY: POST OP INSTRUCTIONS °Always review your discharge instruction sheet given to you by the facility where your surgery was performed. °IF YOU HAVE DISABILITY OR FAMILY LEAVE FORMS, YOU MUST BRING THEM TO THE OFFICE FOR PROCESSING.   °DO NOT GIVE THEM TO YOUR DOCTOR. ° °1. A prescription for pain medication may be given to you upon discharge.  Take your pain medication as prescribed, if needed.  If narcotic pain medicine is not needed, then you may take acetaminophen (Tylenol) or ibuprofen (Advil) as needed. °2. Take your usually prescribed medications unless otherwise directed. °3. If you need a refill on your pain medication, please contact your pharmacy.  They will contact our office to request authorization. Prescriptions will not be filled after 5pm or on week-ends. °4. You should follow a light diet the first few days after arrival home, such as soup and crackers, etc.  Be sure to include lots of fluids daily. °5. Most patients will experience some swelling and bruising in the area of the incisions.  Ice packs will help.  Swelling and bruising can take several days to resolve.  °6. It is common to experience some constipation if taking pain medication after surgery.  Increasing fluid intake and taking a stool softener (such as Colace) will usually help or prevent this problem from occurring.  A mild laxative (Milk of Magnesia or Miralax) should be taken according to package instructions if there are no bowel movements after 48 hours. °7. Unless discharge instructions indicate otherwise, you may remove your bandages 24-48 hours after surgery, and you may shower at that time.  You may have steri-strips (small skin tapes) in place directly over the incision.  These strips should be left on the skin for 7-10 days.  If your surgeon used skin glue on the incision, you may shower in 24 hours.  The glue will flake off over the next 2-3 weeks.  Any sutures or  staples will be removed at the office during your follow-up visit. °8. ACTIVITIES:  You may resume regular (light) daily activities beginning the next day--such as daily self-care, walking, climbing stairs--gradually increasing activities as tolerated.  You may have sexual intercourse when it is comfortable.  Refrain from any heavy lifting or straining until approved by your doctor. °a. You may drive when you are no longer taking prescription pain medication, you can comfortably wear a seatbelt, and you can safely maneuver your car and apply brakes. °b. RETURN TO WORK:  __________________________________________________________ °9. You should see your doctor in the office for a follow-up appointment approximately 2-3 weeks after your surgery.  Make sure that you call for this appointment within a day or two after you arrive home to insure a convenient appointment time. °10. OTHER INSTRUCTIONS: __________________________________________________________________________________________________________________________ __________________________________________________________________________________________________________________________ °WHEN TO CALL YOUR DOCTOR: °1. Fever over 101.0 °2. Inability to urinate °3. Continued bleeding from incision. °4. Increased pain, redness, or drainage from the incision. °5. Increasing abdominal pain ° °The clinic staff is available to answer your questions during regular business hours.  Please don’t hesitate to call and ask to speak to one of the nurses for clinical concerns.  If you have a medical emergency, go to the nearest emergency room or call 911.  A surgeon from [facility] is always on call at the hospital. °[address], [address], [place], [place]  [address] ? [address] [address], [place], [place]   [address] °[phone] ? [phone] ? FAX [phone] °Web site:   [email or web address] °

## 2015-12-21 NOTE — Transfer of Care (Signed)
Immediate Anesthesia Transfer of Care Note  Patient: Brad Crosby  Procedure(s) Performed: Procedure(s): LAPAROSCOPIC CHOLECYSTECTOMY WITH INTRAOPERATIVE CHOLANGIOGRAM  (N/A) HERNIA REPAIR UMBILICAL ADULT (N/A)  Patient Location: PACU  Anesthesia Type:General  Level of Consciousness: awake, alert  and patient cooperative  Airway & Oxygen Therapy: Patient Spontanous Breathing and Patient connected to face mask oxygen  Post-op Assessment: Report given to RN, Post -op Vital signs reviewed and stable, Patient moving all extremities X 4 and Patient able to stick tongue midline  Post vital signs: Reviewed and stable  Last Vitals:  Filed Vitals:   12/21/15 0627  BP: 138/84  Pulse: 74  Temp: 37.1 C  Resp: 20    Complications: No apparent anesthesia complications

## 2015-12-21 NOTE — Interval H&P Note (Signed)
History and Physical Interval Note:  12/21/2015 6:59 AM  Jerilynn SomMichael Fedie  has presented today for surgery, with the diagnosis of Gallstones  The various methods of treatment have been discussed with the patient and family. After consideration of risks, benefits and other options for treatment, the patient has consented to  Procedure(s): LAPAROSCOPIC CHOLECYSTECTOMY WITH INTRAOPERATIVE CHOLANGIOGRAM POSSIBLE OPEN  (N/A) as a surgical intervention .  The patient's history has been reviewed, patient examined, no change in status, stable for surgery.  I have reviewed the patient's chart and labs.  Questions were answered to the patient's satisfaction.     Ernestene MentionINGRAM,Triton Heidrich M

## 2015-12-21 NOTE — Anesthesia Procedure Notes (Signed)
Procedure Name: Intubation Date/Time: 12/21/2015 7:45 AM Performed by: Salomon MastWALL, Phat Dalton COREY Pre-anesthesia Checklist: Patient identified, Emergency Drugs available, Suction available, Patient being monitored and Timeout performed Patient Re-evaluated:Patient Re-evaluated prior to inductionOxygen Delivery Method: Circle system utilized Preoxygenation: Pre-oxygenation with 100% oxygen Intubation Type: IV induction Ventilation: Oral airway inserted - appropriate to patient size Laryngoscope Size: Mac and 3 Grade View: Grade I Tube type: Oral Tube size: 7.0 mm Number of attempts: 1 Airway Equipment and Method: Stylet Placement Confirmation: ETT inserted through vocal cords under direct vision,  positive ETCO2 and breath sounds checked- equal and bilateral Secured at: 22 cm Tube secured with: Tape Dental Injury: Teeth and Oropharynx as per pre-operative assessment

## 2015-12-21 NOTE — Progress Notes (Signed)
Dr.Hodierne also notified of plan to give pt Hydrocodone in phase 2 and good with the plan.

## 2015-12-22 ENCOUNTER — Encounter (HOSPITAL_COMMUNITY): Payer: Self-pay | Admitting: General Surgery

## 2017-09-07 IMAGING — US US ABDOMEN COMPLETE
1 series · 13 of 25 positions shown · non-contrast
Comparison: None.

CLINICAL DATA: Right upper quadrant pain for 1 day

EXAM:
ABDOMEN ULTRASOUND COMPLETE

[Series 1: us abdomen complete · 0.25mm/px · 13 of 86 slices shown]
[im 1/86]
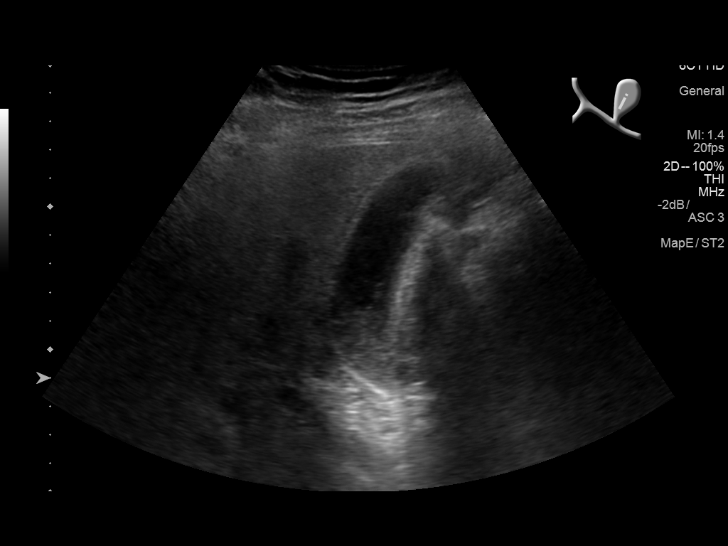
[im 8/86]
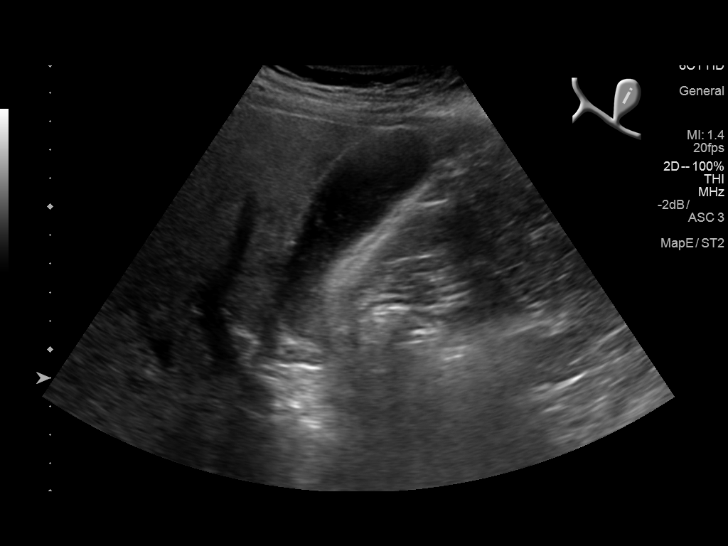
[im 15/86]
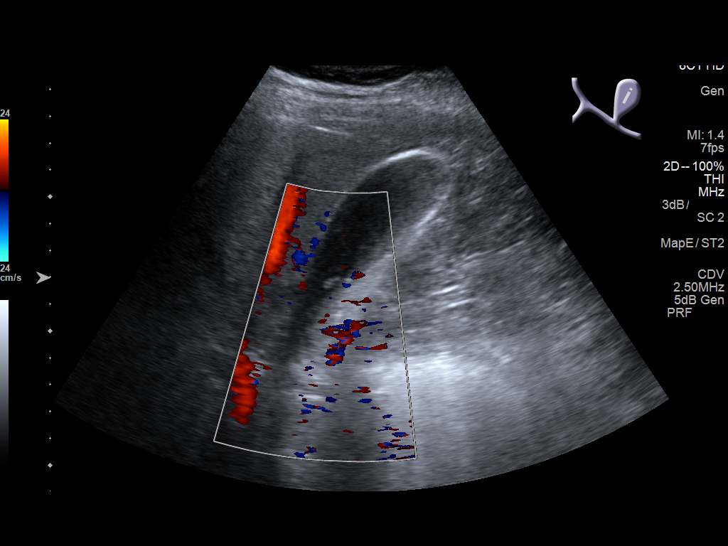
[im 22/86]
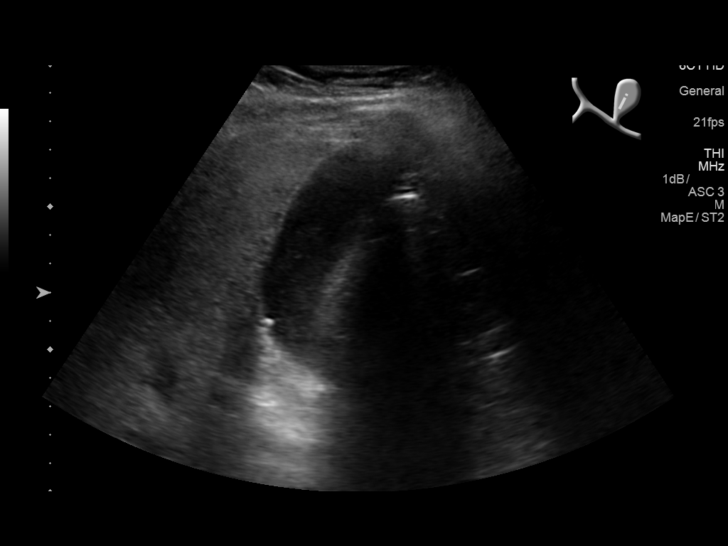
[im 29/86]
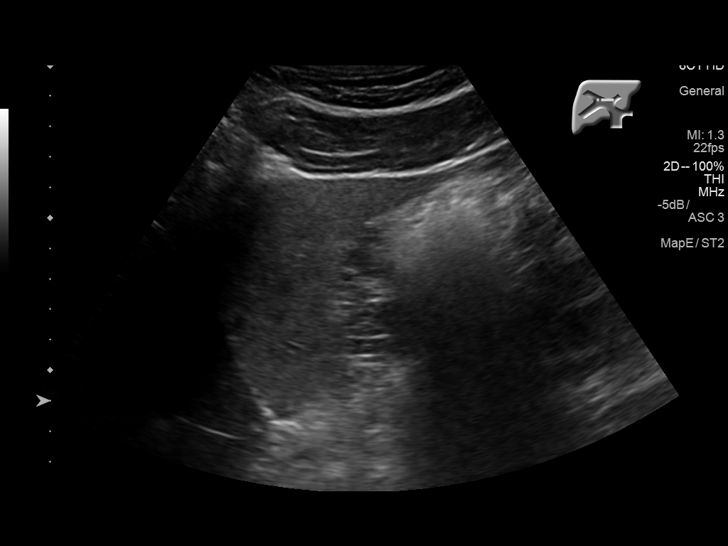
[im 36/86]
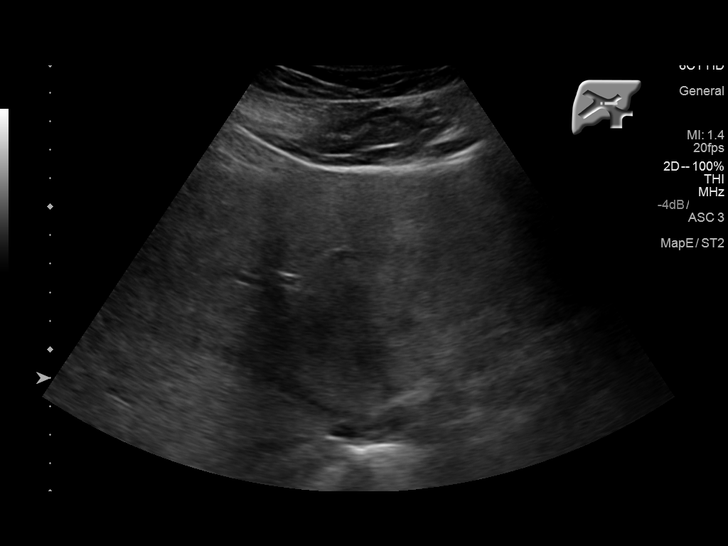
[im 43/86]
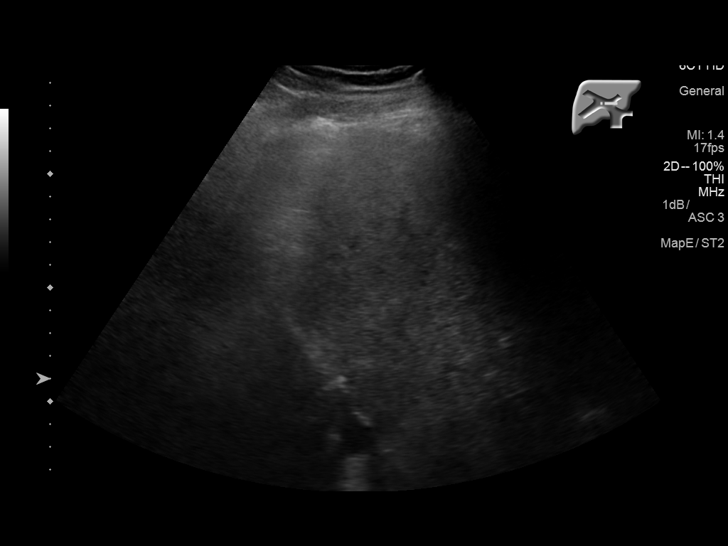
[im 50/86]
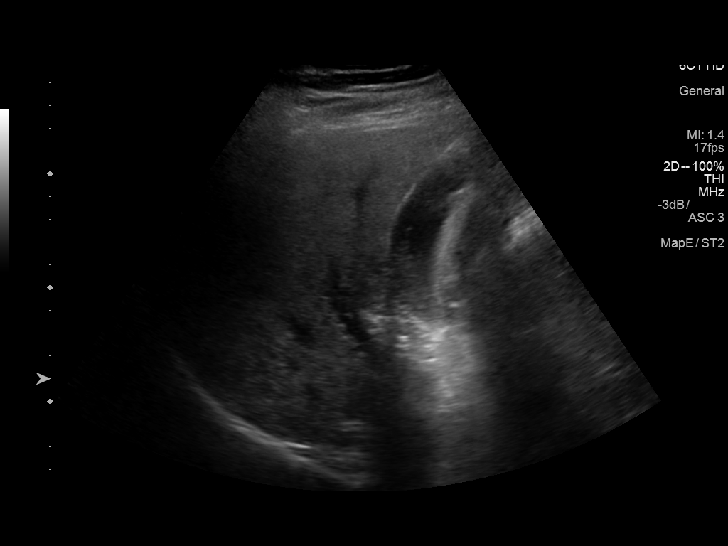
[im 57/86]
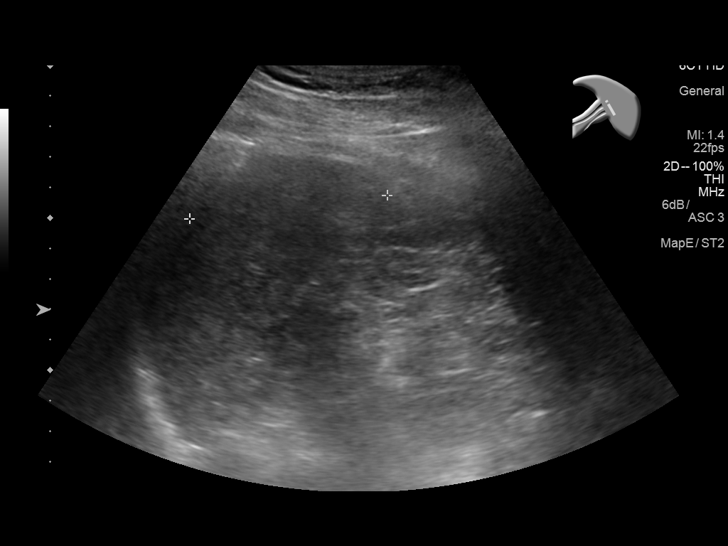
[im 64/86]
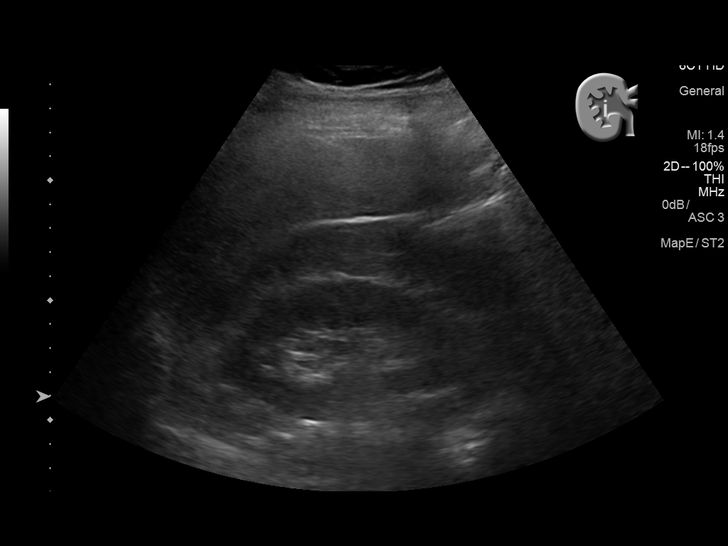
[im 71/86]
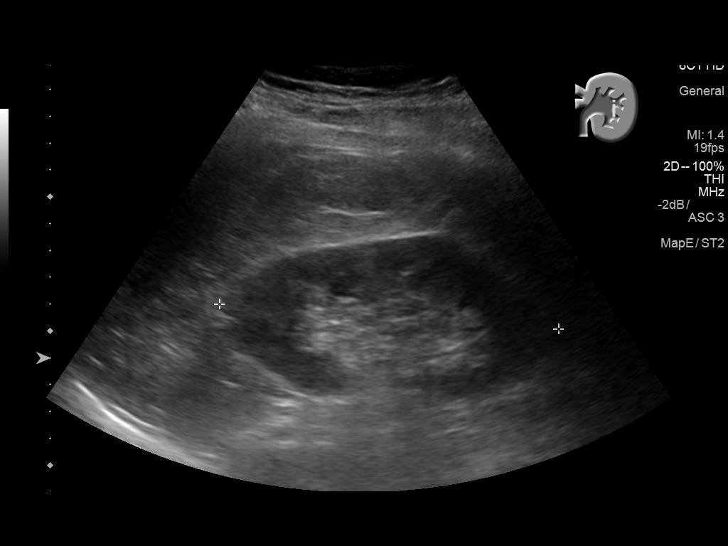
[im 78/86]
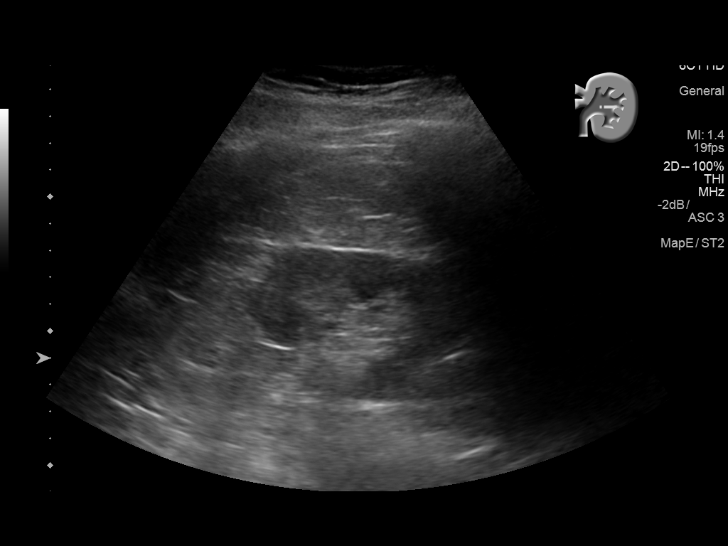
[im 86/86]
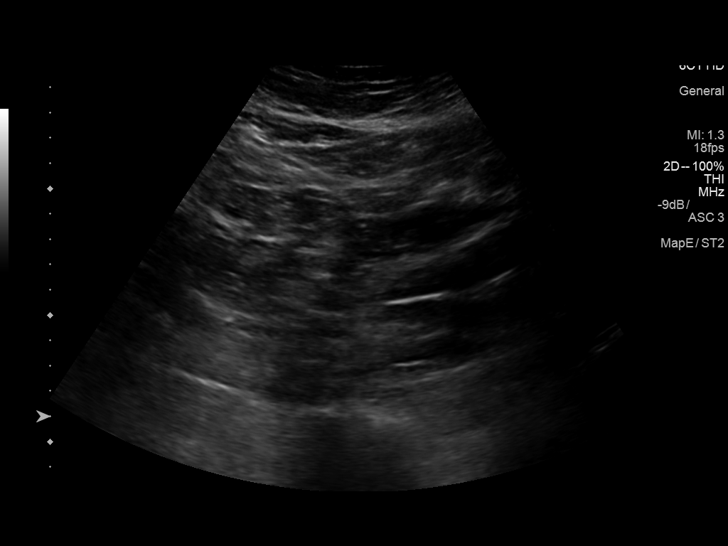

[13 of 25 positions shown; findings below may reference images not displayed]

FINDINGS: Gallbladder: Tiny stones and sludge demonstrated within the
gallbladder. Suggestion of the nondependent structure which may
indicate a floating stone or polyp. This structure measures about 5
mm diameter. Based on size, this would be a benign polyp. No bladder
wall thickening. Murphy's sign is negative.

Common bile duct: Diameter: 3.9 mm, normal

Liver: Diffusely increased parenchymal echotexture suggesting fatty
infiltration. No focal liver lesions identified.

IVC: No abnormality visualized.

Pancreas: Limited visualization due to overlying bowel gas.

Spleen: Size and appearance within normal limits.

Right Kidney: Length: 11.6 cm. Echogenicity within normal limits. No
mass or hydronephrosis visualized.

Left Kidney: Length: 12.7 cm. Echogenicity within normal limits. No
mass or hydronephrosis visualized.

Abdominal aorta: No aneurysm visualized.

Other findings: None.
IMPRESSION: Tiny stones and sludge throughout the gallbladder. No additional
changes to suggest cholecystitis. Probable fatty infiltration of the
liver. No bile duct dilatation.

## 2017-12-19 IMAGING — RF DG CHOLANGIOGRAM OPERATIVE
1 series · 4 of 4 positions shown · non-contrast
Comparison: None.

CLINICAL DATA: LDZ.H (FP0-RM-CM) - Elective surgery LAPAROSCOPIC
CHOLECYSTECTOMY WITH INTRAOPERATIVE CHOLANGIOGRAM Fluoro time 14
seconds

EXAM:
INTRAOPERATIVE CHOLANGIOGRAM
TECHNIQUE: Cholangiographic images from the C-arm fluoroscopic device were
submitted for interpretation post-operatively. Please see the
procedural report for the amount of contrast and the fluoroscopy
time utilized.

[Series 1: run · 4 of 96 frames shown]
[frame 1/96]
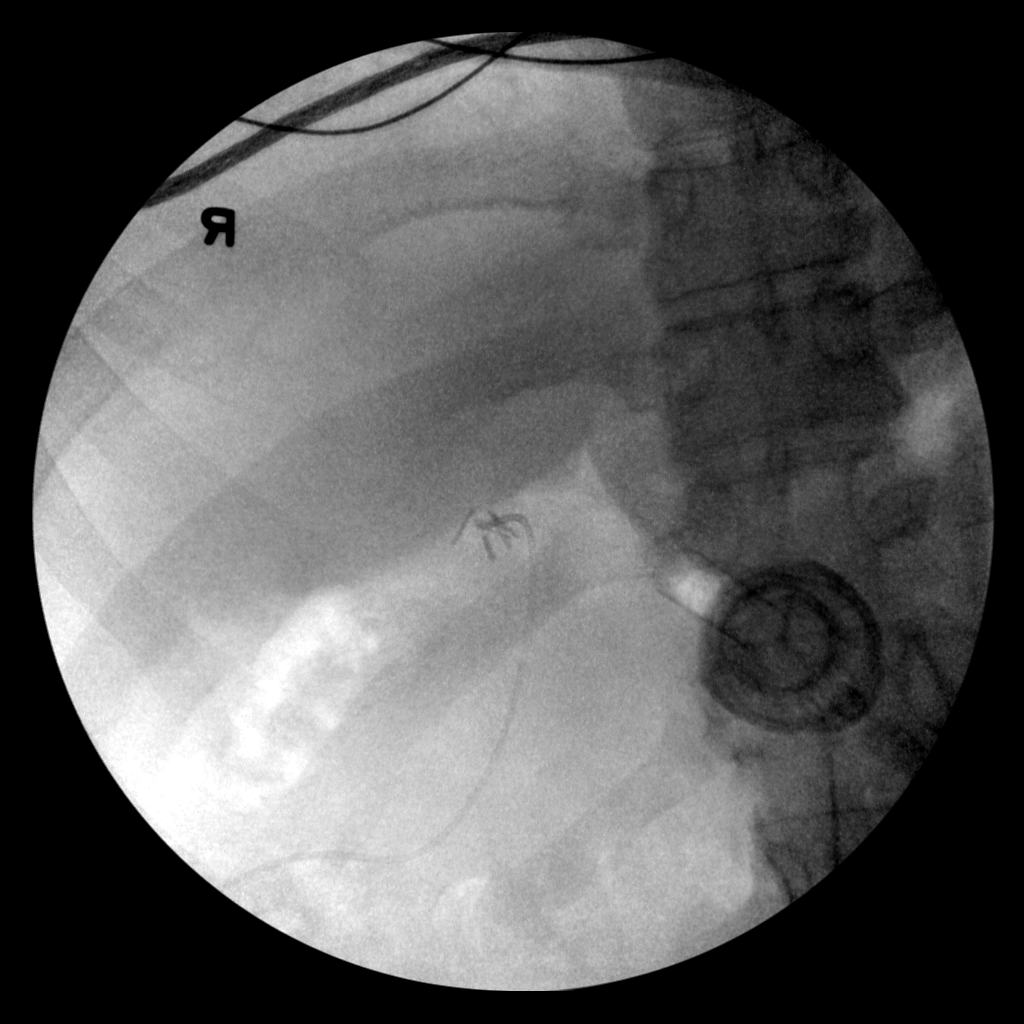
[frame 15/96]
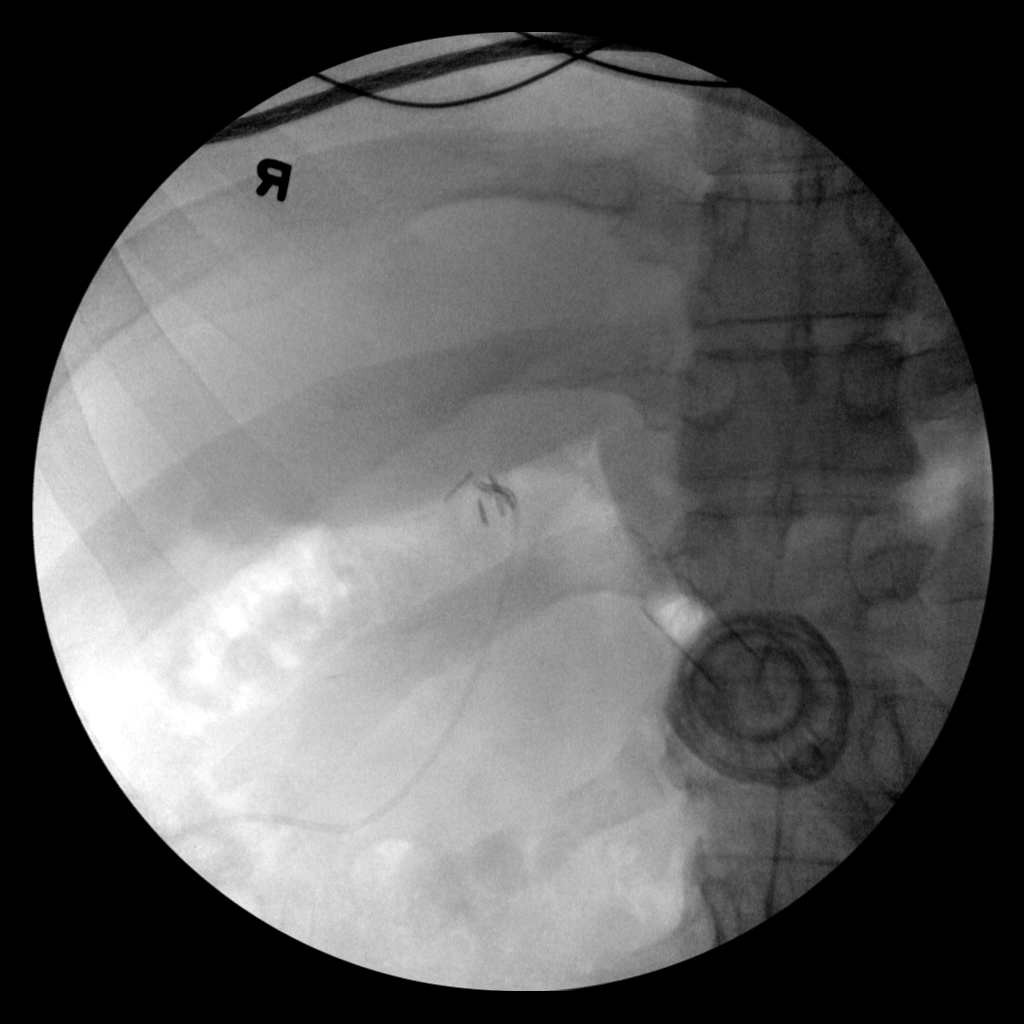
[frame 49/96]
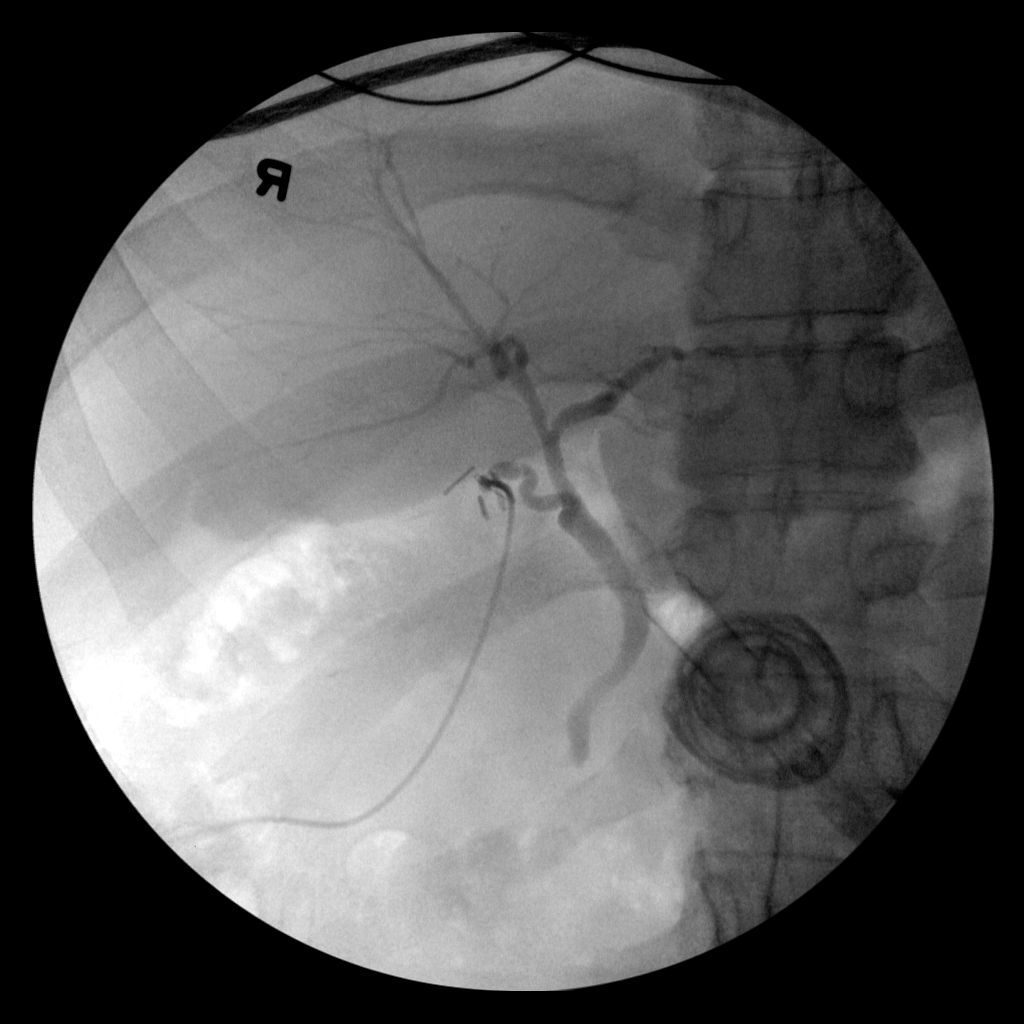
[frame 82/96]
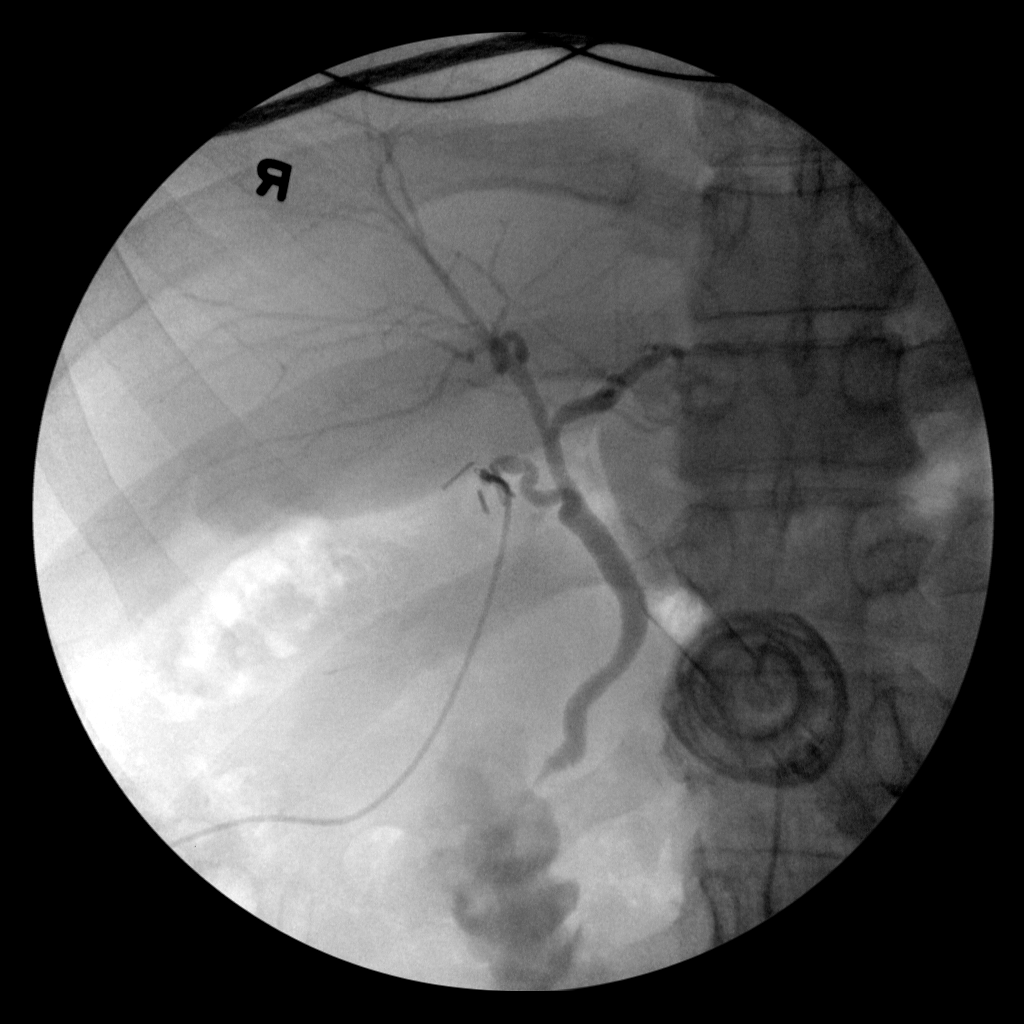

[4 of 4 positions shown; findings below may reference images not displayed]

FINDINGS: No persistent filling defects in the common duct. Intrahepatic ducts
are incompletely visualized, appearing decompressed centrally.
Contrast passes into the duodenum.

:
Negative for retained common duct stone.
